# Patient Record
Sex: Female | Born: 1985 | Race: White | Hispanic: No | Marital: Married | Smoking: Never smoker
Health system: Southern US, Community
[De-identification: ages and names within clinical notes are randomized; demographics above are authoritative.]

## PROBLEM LIST (undated history)

## (undated) DIAGNOSIS — J45909 Unspecified asthma, uncomplicated: Secondary | ICD-10-CM

---

## 2016-02-27 LAB — OB RESULTS CONSOLE RUBELLA ANTIBODY, IGM: Rubella: IMMUNE

## 2016-02-27 LAB — OB RESULTS CONSOLE VARICELLA ZOSTER ANTIBODY, IGG: VARICELLA IGG: IMMUNE

## 2016-02-27 LAB — OB RESULTS CONSOLE HEPATITIS B SURFACE ANTIGEN: Hepatitis B Surface Ag: NEGATIVE

## 2016-02-27 LAB — OB RESULTS CONSOLE HIV ANTIBODY (ROUTINE TESTING): HIV: NONREACTIVE

## 2016-02-27 LAB — OB RESULTS CONSOLE RPR: RPR: NONREACTIVE

## 2016-07-05 ENCOUNTER — Other Ambulatory Visit: Payer: Self-pay | Admitting: Obstetrics and Gynecology

## 2016-07-05 DIAGNOSIS — Z3689 Encounter for other specified antenatal screening: Secondary | ICD-10-CM

## 2016-07-15 ENCOUNTER — Ambulatory Visit: Payer: Self-pay

## 2016-07-25 ENCOUNTER — Ambulatory Visit
Admission: RE | Admit: 2016-07-25 | Discharge: 2016-07-25 | Disposition: A | Discharge status: Home / Self Care | Payer: BLUE CROSS/BLUE SHIELD | Source: Ambulatory Visit | Attending: Maternal & Fetal Medicine | Admitting: Maternal & Fetal Medicine

## 2016-07-25 ENCOUNTER — Ambulatory Visit (HOSPITAL_BASED_OUTPATIENT_CLINIC_OR_DEPARTMENT_OTHER)
Admission: RE | Admit: 2016-07-25 | Discharge: 2016-07-25 | Disposition: A | Discharge status: Home / Self Care | Payer: BLUE CROSS/BLUE SHIELD | Source: Ambulatory Visit | Attending: Obstetrics and Gynecology | Admitting: Obstetrics and Gynecology

## 2016-07-25 ENCOUNTER — Inpatient Hospital Stay (HOSPITAL_COMMUNITY)
Admission: RE | Admit: 2016-07-25 | Discharge: 2016-07-25 | Disposition: A | Discharge status: Home / Self Care | Payer: BLUE CROSS/BLUE SHIELD | Source: Ambulatory Visit

## 2016-07-25 ENCOUNTER — Other Ambulatory Visit: Payer: Self-pay | Admitting: Maternal & Fetal Medicine

## 2016-07-25 DIAGNOSIS — Z3689 Encounter for other specified antenatal screening: Secondary | ICD-10-CM

## 2016-07-25 DIAGNOSIS — O358XX0 Maternal care for other (suspected) fetal abnormality and damage, not applicable or unspecified: Secondary | ICD-10-CM | POA: Diagnosis not present

## 2016-07-25 DIAGNOSIS — Q270 Congenital absence and hypoplasia of umbilical artery: Secondary | ICD-10-CM | POA: Diagnosis not present

## 2016-07-25 DIAGNOSIS — O350XX1 Maternal care for (suspected) central nervous system malformation in fetus, fetus 1: Secondary | ICD-10-CM

## 2016-07-25 DIAGNOSIS — O350XX0 Maternal care for (suspected) central nervous system malformation in fetus, not applicable or unspecified: Secondary | ICD-10-CM | POA: Diagnosis not present

## 2016-07-25 DIAGNOSIS — Z3A27 27 weeks gestation of pregnancy: Secondary | ICD-10-CM | POA: Diagnosis not present

## 2016-07-25 DIAGNOSIS — Q048 Other specified congenital malformations of brain: Secondary | ICD-10-CM | POA: Diagnosis not present

## 2016-07-25 DIAGNOSIS — O3509X0 Maternal care for (suspected) other central nervous system malformation or damage in fetus, not applicable or unspecified: Secondary | ICD-10-CM

## 2016-07-25 NOTE — Progress Notes (Signed)
See genetic counseling note within ultrasound report  Cherly Andersoneborah F. Aayush Gelpi, MS, CGC

## 2016-07-31 LAB — OB RESULTS CONSOLE RPR: RPR: NONREACTIVE

## 2016-07-31 LAB — OB RESULTS CONSOLE HIV ANTIBODY (ROUTINE TESTING): HIV: NONREACTIVE

## 2016-08-08 ENCOUNTER — Other Ambulatory Visit: Payer: Self-pay | Admitting: Maternal & Fetal Medicine

## 2016-08-08 ENCOUNTER — Ambulatory Visit
Admission: RE | Admit: 2016-08-08 | Discharge: 2016-08-08 | Disposition: A | Discharge status: Home / Self Care | Payer: BLUE CROSS/BLUE SHIELD | Source: Ambulatory Visit | Attending: Obstetrics & Gynecology | Admitting: Obstetrics & Gynecology

## 2016-08-08 ENCOUNTER — Encounter: Payer: Self-pay | Admitting: *Deleted

## 2016-08-08 ENCOUNTER — Other Ambulatory Visit: Payer: Self-pay | Admitting: Obstetrics & Gynecology

## 2016-08-08 DIAGNOSIS — Z3A29 29 weeks gestation of pregnancy: Secondary | ICD-10-CM | POA: Diagnosis not present

## 2016-08-08 DIAGNOSIS — O3509X0 Maternal care for (suspected) other central nervous system malformation or damage in fetus, not applicable or unspecified: Secondary | ICD-10-CM

## 2016-08-08 DIAGNOSIS — O350XX0 Maternal care for (suspected) central nervous system malformation in fetus, not applicable or unspecified: Secondary | ICD-10-CM

## 2016-08-08 DIAGNOSIS — Q234 Hypoplastic left heart syndrome: Secondary | ICD-10-CM

## 2016-08-08 NOTE — Progress Notes (Signed)
[facility] Note  Ms. Jewel BaizeCraven presents for fetal monitoring today. She is currently 29 weeks and 3 days. Her pregnancy is complicated by multiple fetal anomalies and polyhydramnios.    Her NST is reactive today with baseline 125/mod var/+ accels/no decels and no ctx.    She her AFI was 31.6 (previously 39 on 8/18 [facility] sono).  She reports active fetal movement.    Plan for repeat NST/AFI in 1 week.  Janeann ForehandS. Murriel Eidem, MD

## 2016-08-15 ENCOUNTER — Ambulatory Visit
Admission: RE | Admit: 2016-08-15 | Discharge: 2016-08-15 | Disposition: A | Discharge status: Home / Self Care | Payer: BLUE CROSS/BLUE SHIELD | Source: Ambulatory Visit | Attending: Maternal & Fetal Medicine | Admitting: Maternal & Fetal Medicine

## 2016-08-15 ENCOUNTER — Other Ambulatory Visit: Payer: Self-pay | Admitting: Maternal & Fetal Medicine

## 2016-08-15 ENCOUNTER — Ambulatory Visit: Payer: BLUE CROSS/BLUE SHIELD

## 2016-08-15 DIAGNOSIS — O350XX0 Maternal care for (suspected) central nervous system malformation in fetus, not applicable or unspecified: Secondary | ICD-10-CM | POA: Insufficient documentation

## 2016-08-15 DIAGNOSIS — Z3A30 30 weeks gestation of pregnancy: Secondary | ICD-10-CM | POA: Insufficient documentation

## 2016-08-15 DIAGNOSIS — O3509X0 Maternal care for (suspected) other central nervous system malformation or damage in fetus, not applicable or unspecified: Secondary | ICD-10-CM

## 2016-08-15 DIAGNOSIS — O403XX0 Polyhydramnios, third trimester, not applicable or unspecified: Secondary | ICD-10-CM | POA: Diagnosis not present

## 2016-08-15 HISTORY — DX: Unspecified asthma, uncomplicated: J45.909

## 2016-08-21 ENCOUNTER — Observation Stay
Admission: EM | Admit: 2016-08-21 | Discharge: 2016-08-21 | Payer: 59 | Attending: Obstetrics and Gynecology | Admitting: Obstetrics and Gynecology

## 2016-08-21 ENCOUNTER — Ambulatory Visit (HOSPITAL_COMMUNITY)
Admission: AD | Admit: 2016-08-21 | Discharge: 2016-08-21 | Disposition: A | Discharge status: Home / Self Care | Payer: 59 | Source: Other Acute Inpatient Hospital | Attending: Obstetrics and Gynecology | Admitting: Obstetrics and Gynecology

## 2016-08-21 ENCOUNTER — Encounter: Payer: Self-pay | Admitting: *Deleted

## 2016-08-21 DIAGNOSIS — Z3A31 31 weeks gestation of pregnancy: Secondary | ICD-10-CM | POA: Insufficient documentation

## 2016-08-21 DIAGNOSIS — Z3A00 Weeks of gestation of pregnancy not specified: Secondary | ICD-10-CM | POA: Insufficient documentation

## 2016-08-21 DIAGNOSIS — O42913 Preterm premature rupture of membranes, unspecified as to length of time between rupture and onset of labor, third trimester: Secondary | ICD-10-CM | POA: Diagnosis not present

## 2016-08-21 DIAGNOSIS — O321XX0 Maternal care for breech presentation, not applicable or unspecified: Secondary | ICD-10-CM | POA: Diagnosis not present

## 2016-08-21 DIAGNOSIS — J45909 Unspecified asthma, uncomplicated: Secondary | ICD-10-CM | POA: Insufficient documentation

## 2016-08-21 DIAGNOSIS — O99513 Diseases of the respiratory system complicating pregnancy, third trimester: Secondary | ICD-10-CM | POA: Insufficient documentation

## 2016-08-21 DIAGNOSIS — O42919 Preterm premature rupture of membranes, unspecified as to length of time between rupture and onset of labor, unspecified trimester: Secondary | ICD-10-CM | POA: Diagnosis present

## 2016-08-21 DIAGNOSIS — O359XX0 Maternal care for (suspected) fetal abnormality and damage, unspecified, not applicable or unspecified: Secondary | ICD-10-CM | POA: Insufficient documentation

## 2016-08-21 DIAGNOSIS — O4290 Premature rupture of membranes, unspecified as to length of time between rupture and onset of labor, unspecified weeks of gestation: Secondary | ICD-10-CM | POA: Diagnosis present

## 2016-08-21 LAB — COMPREHENSIVE METABOLIC PANEL
ALBUMIN: 2.8 g/dL — AB (ref 3.5–5.0)
ALK PHOS: 70 U/L (ref 38–126)
ALT: 12 U/L — AB (ref 14–54)
AST: 17 U/L (ref 15–41)
Anion gap: 8 (ref 5–15)
BUN: 7 mg/dL (ref 6–20)
CALCIUM: 8.5 mg/dL — AB (ref 8.9–10.3)
CO2: 20 mmol/L — AB (ref 22–32)
CREATININE: 0.71 mg/dL (ref 0.44–1.00)
Chloride: 108 mmol/L (ref 101–111)
GFR calc Af Amer: >60 mL/min (ref >60)
GFR calc non Af Amer: >60 mL/min (ref >60)
GLUCOSE: 67 mg/dL (ref 65–99)
Potassium: 3.7 mmol/L (ref 3.5–5.1)
SODIUM: 136 mmol/L (ref 135–145)
Total Bilirubin: <0.1 mg/dL — ABNORMAL LOW (ref 0.3–1.2)
Total Protein: 6.2 g/dL — ABNORMAL LOW (ref 6.5–8.1)

## 2016-08-21 LAB — PROTEIN / CREATININE RATIO, URINE
Creatinine, Urine: 325 mg/dL
Protein Creatinine Ratio: 0.13 mg/mg{Cre} (ref 0.00–0.15)
Total Protein, Urine: 43 mg/dL

## 2016-08-21 LAB — CBC
HEMATOCRIT: 35.7 % (ref 35.0–47.0)
HEMOGLOBIN: 12.2 g/dL (ref 12.0–16.0)
MCH: 28.9 pg (ref 26.0–34.0)
MCHC: 34.2 g/dL (ref 32.0–36.0)
MCV: 84.4 fL (ref 80.0–100.0)
Platelets: 154 10*3/uL (ref 150–440)
RBC: 4.23 MIL/uL (ref 3.80–5.20)
RDW: 13.7 % (ref 11.5–14.5)
WBC: 11.6 10*3/uL — AB (ref 3.6–11.0)

## 2016-08-21 LAB — TYPE AND SCREEN
ABO/RH(D): A POS
ANTIBODY SCREEN: NEGATIVE

## 2016-08-21 MED ORDER — LIDOCAINE HCL (PF) 1 % IJ SOLN: 30.0000 mL | INTRAMUSCULAR | Status: DC | PRN

## 2016-08-21 MED ORDER — LACTATED RINGERS IV SOLN
INTRAVENOUS | Status: DC
  Administered 2016-08-21: 100 mL/h via INTRAVENOUS

## 2016-08-21 MED ORDER — SODIUM CHLORIDE FLUSH 0.9 % IV SOLN
INTRAVENOUS | Status: AC
  Filled 2016-08-21: qty 10

## 2016-08-21 MED ORDER — MAGNESIUM SULFATE 50 % IJ SOLN
2.0000 g/h | INTRAVENOUS | Status: DC
  Filled 2016-08-21: qty 80

## 2016-08-21 MED ORDER — AMOXICILLIN 250 MG PO CAPS
250.0000 mg | ORAL_CAPSULE | Freq: Three times a day (TID) | ORAL | Status: DC
  Filled 2016-08-21: qty 1

## 2016-08-21 MED ORDER — MAGNESIUM SULFATE BOLUS VIA INFUSION
6.0000 g | Freq: Once | INTRAVENOUS | Status: AC
  Administered 2016-08-21: 6 g via INTRAVENOUS
  Filled 2016-08-21: qty 500

## 2016-08-21 MED ORDER — ERYTHROMYCIN BASE 250 MG PO TABS: 250.0000 mg | ORAL_TABLET | Freq: Four times a day (QID) | ORAL | Status: DC

## 2016-08-21 MED ORDER — BETAMETHASONE SOD PHOS & ACET 6 (3-3) MG/ML IJ SUSP
12.0000 mg | Freq: Once | INTRAMUSCULAR | Status: AC
  Administered 2016-08-21: 6 mg via INTRAMUSCULAR
  Filled 2016-08-21: qty 2

## 2016-08-21 MED ORDER — OXYTOCIN 40 UNITS IN LACTATED RINGERS INFUSION - SIMPLE MED: 2.5000 [IU]/h | INTRAVENOUS | Status: DC

## 2016-08-21 MED ORDER — PRENATAL MULTIVITAMIN CH
1.0000 | ORAL_TABLET | Freq: Every day | ORAL | Status: DC
  Filled 2016-08-21: qty 1

## 2016-08-21 MED ORDER — SOD CITRATE-CITRIC ACID 500-334 MG/5ML PO SOLN: 30.0000 mL | ORAL | Status: DC | PRN

## 2016-08-21 MED ORDER — LACTATED RINGERS IV SOLN: 500.0000 mL | INTRAVENOUS | Status: DC | PRN

## 2016-08-21 MED ORDER — OXYTOCIN BOLUS FROM INFUSION: 500.0000 mL | Freq: Once | INTRAVENOUS | Status: DC

## 2016-08-21 MED ORDER — ONDANSETRON HCL 4 MG/2ML IJ SOLN: 4.0000 mg | Freq: Four times a day (QID) | INTRAMUSCULAR | Status: DC | PRN

## 2016-08-21 MED ORDER — BETAMETHASONE SOD PHOS & ACET 6 (3-3) MG/ML IJ SUSP: 12.0000 mg | Freq: Once | INTRAMUSCULAR | Status: DC

## 2016-08-21 MED ORDER — SODIUM CHLORIDE 0.9 % IV SOLN
250.0000 mg | Freq: Four times a day (QID) | INTRAVENOUS | Status: DC
  Administered 2016-08-21: 250 mg via INTRAVENOUS
  Filled 2016-08-21 (×4): qty 5

## 2016-08-21 MED ORDER — SODIUM CHLORIDE 0.9 % IV SOLN
2.0000 g | Freq: Four times a day (QID) | INTRAVENOUS | Status: DC
  Administered 2016-08-21: 2 g via INTRAVENOUS
  Filled 2016-08-21: qty 2000

## 2016-08-21 NOTE — Progress Notes (Signed)
Report called to Milderd [facility]Juanita [facility] at [facility]. Transport at bedside.

## 2016-08-21 NOTE — Progress Notes (Signed)
Sterile speculum exam performed cervix visually closed, significant pooling confirms PPROM along with previous positive nitrazine.  [facility]

## 2016-08-21 NOTE — Progress Notes (Signed)
Urine sent to lab for PCR test.

## 2016-08-21 NOTE — Progress Notes (Signed)
Patient left with transport team via stretcher in stable condition.

## 2016-08-21 NOTE — Final Progress Note (Signed)
Physician Final Progress Note  Patient ID: Dana GoldenStacey L Jones MRN: [id number] DOB/AGE: 1986-04-18 30 y.o.  Admit date: 08/21/2016 Admitting provider: Vena AustriaAndreas Keyshla Tunison, MD Discharge date: 08/21/2016   Admission Diagnoses: Preterm premature rupture of membranes  Discharge Diagnoses:  Active Problems:   Preterm premature rupture of membranes  Fetal ventriculomegaly  Fetal agenesis corpus collosum  Single Umbilical artery  Right ventricular hypertrophy  Consults: None  Vitals:   08/21/16 1412  BP: (!) 132/92  Pulse: 84  Resp: 18  Temp: 98.4 F (36.9 C)  TempSrc: Oral     Significant Findings/ Diagnostic Studies:  No results found for this or any previous visit (from the past 24 hour(s)).  Procedures: NST 130, moderate variability, +accels, no decels reactive category I tracing with negative tocometry  Discharge Condition: stable  Disposition: Final discharge disposition not confirmed  Diet: Clear liquid diet  Discharge Activity: Bedrest     Medication List    TAKE these medications   multivitamin-prenatal 27-0.8 MG Tabs tablet Take 1 tablet by mouth daily at 12 noon.        Total time spent taking care of this patient: 60 minutes direct patient care, review of records, coordinating care with NICU staff and accepting facility  Signed: Lorrene ReidSTAEBLER, Edmundo Tedesco M 08/21/2016, 3:11 PM

## 2016-08-21 NOTE — Progress Notes (Signed)
Sp[facility] with [facility]. Pershing CoxPhillips Heine, [facility]  Also discussed if any utility in sampling amniotic fluid for cytogenetics and concern there is maternal contamination of the sample.

## 2016-08-21 NOTE — Progress Notes (Signed)
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  Nitrazine test positive. With clear color fluid noted.  Pt. SROM at 1330 today with larger amount of fluid, gush. No complaints of contractions, abd. Palpate soft.  [facility]; fetal position breech.  FHR -120s.

## 2016-08-21 NOTE — Progress Notes (Signed)
Pt. Currently resting in low lying right side position, talking with husband. Magnesium Sulfate 40 Units in 500 ml of LR; bolus of 6 grams given, currently receiving 2 g maintenance dose. IV infusing at 100 ml/hr of LR. IV located in right hand with 20 g. FHR with baseline of 125, with accels and occasional variables . BMZ, 12 mg given via Right gluteal muscle. Pt. Currently resting without complaints. Pain 0/10. Amnionic fluid continues to leak, clear fluid.  RN will continue to monitor.

## 2016-08-21 NOTE — H&P (Signed)
Obstetric H&P   Chief Complaint: "My water broke"  Prenatal Care Provider: [facility]  History of Present Illness: 30 y.o. 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  [facility] bilateral ventriculomegaly, single umbilical artery, suspected agenesis of the corpus callosum, polyhydramnios.  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  The study was limited by fetal position and could not rule out a ventricular septal defect in the perimembranous region.  She was scheduled to see [facility] in follow up tomorrow and have a repeat fetal echo scheduled as well.  1st trimester screen and MSAFP were negative this pregnancy.  G1 pregnancy uncomplicated. +FM, no VB, no contractions.  No headaches, vision changes, RUQ or epigastric pain, increased edema.  Normotensive throughout this pregnancy with no history of CHTN.   Weight gain this pregnnacy 9lbs to date.  Most recent fetal biometry 08/02/2016 1,274g c/w 42%ile at 6568w4d.    ABO, Rh: A pos Antibody: ABSC negative Rubella: Immune Varicella: Immune 1st Trimester screen: T:21 <1 in 10,000; T:18 <1 in 10,000 MSAFP: screen negative 1 in 1373 HBsAg: negative HIV: negative RPR: negative 1-hr: early 109, 28-week 96 GBS: unknown  TDAP received 08/15/2016  Review of Systems: 10 point review of systems negative unless otherwise noted in HPI  Past Medical History: Past Medical History:  Diagnosis Date  . Asthma     Past Surgical History: No past surgical history on file.  Family History: No family history on file.  Social History: Social History   Social History  . Marital status: Married    Spouse name: N/A  . Number of children:  N/A  . Years of education: N/A   Occupational History  . Not on file.   Social History Main Topics  . Smoking status: Never Smoker  . Smokeless tobacco: Never Used  . Alcohol use No  . Drug use: No  . Sexual activity: Yes   Other Topics Concern  . Not on file   Social History Narrative  . No narrative on file    Medications: Prior to Admission medications   Medication Sig Start Date End Date Taking? Authorizing Provider  Prenatal Vit-Fe Fumarate-FA (MULTIVITAMIN-PRENATAL) 27-0.8 MG TABS tablet Take 1 tablet by mouth daily at 12 noon.    Historical Provider, MD    Allergies: Allergies  Allergen Reactions  . Tylenol [Acetaminophen]     [facility] brand only    Physical Exam: Vitals: Blood pressure (!) 132/92, pulse 84, temperature 98.4 F (36.9 C), temperature source Oral, resp. rate 18.  Urine Dip Protein: P/C ratio pending  FHT: 130, moderate variability, +accels, no decels Toco: absent  General: NAD HEENT: normocephalic, anicteric Pulmonary: No increased work of breathing Abdomen: Gravid,  Non-tender Leopolds: US confirmed frank breech presentation Genitourinary: will perform sterile speculum exam to assess cervical dilation Extremities: no edema  Labs: No results found for this or any previous visit (from the past 24 hour(s)).  Assessment: 30 y.o. G2P1001 at 8810w2d by by 7 week US derived EDD of 10/21/2016, PPROM with known fetal anomalies, frank breech presentation  Plan: 1) PPROM - start latency antibiotics - BMZ - magnesium sulfate for CP ppx  - GBS culture obtained  2) Fetus - -  female - bilateral ventriculomegaly, agenesis of corpus collosum, SUA, polyhydr amnios, mild to moderate right ventricular hypertrophy - 9lbs weight gain - Frank breech presentation, if labor will need primary C-section for delivery  3) PNL - A pos / ABSC neg / RI / VZI / RPR NR / HIV neg / 1-hr 96 / GBS unknown  4) TDAP - 08/15/2016  5) Elevated BP on initial presentation -  CMP, P/C ratio ordered  6) DVT ppx - SCD's  7) Disposition - pending further evaluation, if not laboring consider transfer to tertiary care center

## 2016-08-22 ENCOUNTER — Ambulatory Visit: Payer: BLUE CROSS/BLUE SHIELD

## 2016-08-22 LAB — RPR: RPR: NONREACTIVE

## 2016-08-23 LAB — CULTURE, BETA STREP (GROUP B ONLY)

## 2017-06-13 ENCOUNTER — Encounter (HOSPITAL_COMMUNITY): Payer: Self-pay
